# Patient Record
Sex: Male | Born: 1995 | Race: White | Hispanic: No | Marital: Single | Smoking: Current every day smoker
Health system: Southern US, Community
[De-identification: ages and names within clinical notes are randomized; demographics above are authoritative.]

---
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
## 2021-11-20 ENCOUNTER — Ambulatory Visit (INDEPENDENT_AMBULATORY_CARE_PROVIDER_SITE_OTHER): Payer: BC Managed Care – PPO

## 2021-11-20 ENCOUNTER — Ambulatory Visit
Admission: EM | Admit: 2021-11-20 | Discharge: 2021-11-20 | Disposition: A | Discharge status: Home / Self Care | Payer: BC Managed Care – PPO | Attending: Nurse Practitioner | Admitting: Nurse Practitioner

## 2021-11-20 ENCOUNTER — Encounter: Payer: Self-pay | Admitting: Emergency Medicine

## 2021-11-20 DIAGNOSIS — S63601A Unspecified sprain of right thumb, initial encounter: Secondary | ICD-10-CM | POA: Diagnosis not present

## 2021-11-20 DIAGNOSIS — M79644 Pain in right finger(s): Secondary | ICD-10-CM

## 2021-11-20 NOTE — ED Triage Notes (Signed)
Hit right thumb while closing a hood on a car today

## 2021-11-20 NOTE — Discharge Instructions (Addendum)
The x-rays are negative for fracture or dislocation.  Based on the mechanism of injury, symptoms are consistent with a sprain of the right thumb. May take over-the-counter Tylenol or ibuprofen as needed for pain or discomfort. Apply ice to the right thumb.  Apply for 20 minutes, remove for 1 hour, then repeat is much as possible to help with pain and swelling if it develops. Gentle range of motion exercises to the right thumb to help with recovery. If symptoms do not improve over the next 2 to 4 weeks, recommend that you follow-up with an orthopedist or hand specialist for further evaluation. Follow-up as needed.

## 2021-11-20 NOTE — ED Provider Notes (Signed)
[facility]    CSN: [id number] Arrival date & time: 11/20/21  1641      History   Chief Complaint No chief complaint on file.   HPI Larry Henry is a 26 y.o. male.   The history is provided by the patient.   Patient presents for complaints of right thumb pain that occurred when he was closing the hood of a car.  Patient states that he "jammed" the right thumb.  Since that time he has pain to the right thumb.  He has good range of motion, but states that when he tries to touch his thumb to his ring finger and pinky finger, the pain starts.  Pain worsens with movement of the right thumb.  Patient states that he is right-hand dominant.  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  Denies numbness, tingling, or radiation of pain at this time.  He has not taken any medication for his symptoms.  History reviewed. No pertinent past medical history.  There are no problems to display for this patient.   History reviewed. No pertinent surgical history.     Home Medications    Prior to Admission medications   Not on File    Family History History reviewed. No pertinent family history.  Social History Social History   Tobacco Use   Smoking status: Every Day    Packs/day: 0.50    Types: Cigarettes  Substance Use Topics   Alcohol use: No   Drug use: No     Allergies   Patient has no known allergies.   Review of Systems Review of Systems Per HPI  Physical Exam Triage Vital Signs ED Triage Vitals  Enc Vitals Group     BP 11/20/21 1727 (!) 166/108     Pulse Rate 11/20/21 1727 68     Resp 11/20/21 1727 18     Temp 11/20/21 1727 98.5 F (36.9 C)     Temp Source 11/20/21 1727 Oral     SpO2 11/20/21 1727 98 %     Weight --      Height --      Head Circumference --      Peak Flow --      Pain Score 11/20/21 1728 6     Pain Loc --      Pain Edu? --      Excl. in GC? --    No data found.  Updated Vital Signs BP (!) 166/108 (BP  Location: Right Arm)   Pulse 68   Temp 98.5 F (36.9 C) (Oral)   Resp 18   SpO2 98%   Visual Acuity Right Eye Distance:   Left Eye Distance:   Bilateral Distance:    Right Eye Near:   Left Eye Near:    Bilateral Near:     Physical Exam Vitals and nursing note reviewed.  Constitutional:      General: He is not in acute distress.    Appearance: Normal appearance.  Pulmonary:     Effort: Pulmonary effort is normal.  Musculoskeletal:     Right hand: Tenderness (Tenderness noted to the right thumb. There is no obvious deformity, ecchymosis, erythema, or swelling present.) present. Normal range of motion. Normal capillary refill. Normal pulse.  Neurological:     General: No focal deficit present.     Mental Status: He is alert and oriented to person, place, and time.  Psychiatric:        Mood and Affect: Mood  normal.        Behavior: Behavior normal.      UC Treatments / Results  Labs (all labs ordered are listed, but only abnormal results are displayed) Labs Reviewed - No data to display  EKG   Radiology DG Finger Thumb Right  Result Date: 11/20/2021 CLINICAL DATA:  Hit thumb while closed car hood. Thumb got stuck in car hood today. EXAM: RIGHT THUMB 2+V COMPARISON:  None Available. FINDINGS: Normal bone mineralization. No acute fracture is seen. No dislocation. Joint spaces are preserved. IMPRESSION: No acute fracture. Electronically Signed   By: Yvonne Kendall M.D.   On: 11/20/2021 17:43    Procedures Procedures (including critical care time)  Medications Ordered in UC Medications - No data to display  Initial Impression / Assessment and Plan / UC Course  I have reviewed the triage vital signs and the nursing notes.  Pertinent labs & imaging results that were available during my care of the patient were reviewed by me and considered in my medical decision making (see chart for details).  Patient presents for complaints of pain to the right thumb after he jammed  it closing the hood of a car today.  On exam, patient is well-appearing, he is in no acute distress.  He is hypertensive, but vital signs are otherwise stable.  X-ray is negative for fracture or dislocation.  Based on the mechanism of injury, symptoms are consistent with a sprain of the right thumb.  Supportive care recommendations were provided to the patient to include use of over-the-counter analgesics such as Tylenol or ibuprofen, the application of ice, and gentle range of motion for recovery.  Patient was advised to follow-up with orthopedics or hand specialist within the next 2 to 4 weeks if symptoms do not improve.  Patient verbalized understanding.  All questions were answered.  Patient stable for discharge. Final Clinical Impressions(s) / UC Diagnoses   Final diagnoses:  Sprain of right thumb, unspecified site of digit, initial encounter     Discharge Instructions      The x-rays are negative for fracture or dislocation.  Based on the mechanism of injury, symptoms are consistent with a sprain of the right thumb. May take over-the-counter Tylenol or ibuprofen as needed for pain or discomfort. Apply ice to the right thumb.  Apply for 20 minutes, remove for 1 hour, then repeat is much as possible to help with pain and swelling if it develops. Gentle range of motion exercises to the right thumb to help with recovery. If symptoms do not improve over the next 2 to 4 weeks, recommend that you follow-up with an orthopedist or hand specialist for further evaluation. Follow-up as needed.     ED Prescriptions   None    PDMP not reviewed this encounter.   Tish Men, NP 11/20/21 [phone]

## 2023-08-29 ENCOUNTER — Ambulatory Visit
Admission: EM | Admit: 2023-08-29 | Discharge: 2023-08-29 | Disposition: A | Discharge status: Home / Self Care | Attending: Nurse Practitioner | Admitting: Nurse Practitioner

## 2023-08-29 DIAGNOSIS — T63441A Toxic effect of venom of bees, accidental (unintentional), initial encounter: Secondary | ICD-10-CM

## 2023-08-29 DIAGNOSIS — L539 Erythematous condition, unspecified: Secondary | ICD-10-CM | POA: Diagnosis not present

## 2023-08-29 MED ORDER — DEXAMETHASONE SODIUM PHOSPHATE 10 MG/ML IJ SOLN
10.0000 mg | INTRAMUSCULAR | Status: AC
  Administered 2023-08-29: 10 mg via INTRAMUSCULAR

## 2023-08-29 MED ORDER — PREDNISONE 20 MG PO TABS: 40.0000 mg | ORAL_TABLET | Freq: Every day | ORAL | 0 refills | Status: AC

## 2023-08-29 NOTE — ED Triage Notes (Signed)
 Pt reports he stepped in a bee's nest and now has a bite on his left side ribcage, left upper arm, and lower leg x 1 day States he is swelling due to bite    Took benadryl

## 2023-08-29 NOTE — ED Provider Notes (Signed)
 [facility]    CSN: [id number] Arrival date & time: 08/29/23  1438      History   Chief Complaint Chief Complaint  Patient presents with   Insect Bite    HPI Larry Henry is a 28 y.o. male.   The history is provided by the patient.   Patient presents after he was stung by yellow jackets approximately 1 day ago.  Patient reports swelling to the left upper arm, left rib cage, and right ankle.  States the swelling is getting worse.  Patient states that the area is itchy.  States that he subsequently developed hives.  He denies chest pain, abdominal pain, throat swelling, tongue swelling, lip swelling, or trouble breathing.  Patient states that he did take Benadryl for his symptoms. History reviewed. No pertinent past medical history.  There are no active problems to display for this patient.   History reviewed. No pertinent surgical history.     Home Medications    Prior to Admission medications   Not on File    Family History History reviewed. No pertinent family history.  Social History Social History   Tobacco Use   Smoking status: Every Day    Current packs/day: 0.50    Types: Cigarettes  Substance Use Topics   Alcohol use: No   Drug use: No     Allergies   Patient has no known allergies.   Review of Systems Review of Systems Per HPI  Physical Exam Triage Vital Signs ED Triage Vitals [08/29/23 1508]  Encounter Vitals Group     BP (!) 143/88     Girls Systolic BP Percentile      Girls Diastolic BP Percentile      Boys Systolic BP Percentile      Boys Diastolic BP Percentile      Pulse Rate 66     Resp 18     Temp 98.2 F (36.8 C)     Temp Source Oral     SpO2 96 %     Weight      Height      Head Circumference      Peak Flow      Pain Score      Pain Loc      Pain Education      Exclude from Growth Chart    No data found.  Updated Vital Signs BP (!) 143/88 (BP Location: Right Arm)   Pulse 66   Temp 98.2 F  (36.8 C) (Oral)   Resp 18   SpO2 96%   Visual Acuity Right Eye Distance:   Left Eye Distance:   Bilateral Distance:    Right Eye Near:   Left Eye Near:    Bilateral Near:     Physical Exam Vitals and nursing note reviewed.  Constitutional:      General: He is not in acute distress.    Appearance: Normal appearance.  HENT:     Head: Normocephalic.     Nose: Nose normal.     Mouth/Throat:     Mouth: Mucous membranes are moist.  Eyes:     Extraocular Movements: Extraocular movements intact.     Conjunctiva/sclera: Conjunctivae normal.     Pupils: Pupils are equal, round, and reactive to light.  Cardiovascular:     Rate and Rhythm: Normal rate and regular rhythm.     Pulses: Normal pulses.     Heart sounds: Normal heart sounds.  Pulmonary:     Effort: Pulmonary  effort is normal. No respiratory distress.     Breath sounds: Normal breath sounds. No stridor. No wheezing, rhonchi or rales.  Abdominal:     General: Bowel sounds are normal.     Palpations: Abdomen is soft.     Tenderness: There is no abdominal tenderness.  Musculoskeletal:     Cervical back: Normal range of motion.  Skin:    General: Skin is warm and dry.     Comments: Erythematous induration noted to to the left upper arm, left rib cage, and right ankle.  The areas are swollen.  There is no oozing, fluctuance, or drainage present.  Neurological:     General: No focal deficit present.     Mental Status: He is alert and oriented to person, place, and time.  Psychiatric:        Mood and Affect: Mood normal.        Behavior: Behavior normal.      UC Treatments / Results  Labs (all labs ordered are listed, but only abnormal results are displayed) Labs Reviewed - No data to display  EKG   Radiology No results found.  Procedures Procedures (including critical care time)  Medications Ordered in UC Medications - No data to display  Initial Impression / Assessment and Plan / UC Course  I have  reviewed the triage vital signs and the nursing notes.  Pertinent labs & imaging results that were available during my care of the patient were reviewed by me and considered in my medical decision making (see chart for details).  Will treat patient for insect bites with Decadron 10 mg IM.  Will start patient on prednisone 40 mg for the next 5 days to help with swelling and inflammation.  Supportive care recommendations were provided discussed with the patient to include over-the-counter analgesics, over-the-counter antihistamines, and to monitor for worsening symptoms.  Patient was given strict ER follow-up precautions.  Patient was in agreement with this plan of care and verbalizes understanding.  All questions were answered.  Patient stable for discharge.  Final Clinical Impressions(s) / UC Diagnoses   Final diagnoses:  None   Discharge Instructions   None    ED Prescriptions   None    PDMP not reviewed this encounter.   Gilmer Etta PARAS, NP 08/29/23 1534

## 2023-08-29 NOTE — Discharge Instructions (Signed)
 You are given an injection of Decadron 10 mg today.  Start the prednisone on 08/30/2023. You may take over-the-counter Tylenol or ibuprofen as needed for pain or discomfort. Apply cool compresses or ice to the affected areas to help with pain or swelling. Continue over-the-counter Benadryl as needed for itching, swelling, or allergic reaction.  You may also take over-the-counter cetirizine, Allegra, or Claritin. Go to the emergency department immediately if you experience trouble breathing, tongue swelling, lip swelling, chest pain, or other concerns. Follow-up as needed.
